# Patient Record
Sex: Female | Born: 1980 | Race: Black or African American | Hispanic: No | Marital: Single | Smoking: Current every day smoker
Health system: Southern US, Community
[De-identification: ages and names within clinical notes are randomized; demographics above are authoritative.]

---

## 2005-11-07 ENCOUNTER — Emergency Department (HOSPITAL_COMMUNITY): Admission: EM | Admit: 2005-11-07 | Discharge: 2005-11-07 | Payer: Self-pay | Admitting: Emergency Medicine

## 2021-07-29 ENCOUNTER — Encounter (HOSPITAL_BASED_OUTPATIENT_CLINIC_OR_DEPARTMENT_OTHER): Payer: Self-pay

## 2021-07-29 ENCOUNTER — Other Ambulatory Visit: Payer: Self-pay

## 2021-07-29 ENCOUNTER — Emergency Department (HOSPITAL_BASED_OUTPATIENT_CLINIC_OR_DEPARTMENT_OTHER): Payer: Commercial Managed Care - PPO

## 2021-07-29 ENCOUNTER — Emergency Department (HOSPITAL_BASED_OUTPATIENT_CLINIC_OR_DEPARTMENT_OTHER)
Admission: EM | Admit: 2021-07-29 | Discharge: 2021-07-29 | Disposition: A | Discharge status: Home / Self Care | Payer: Commercial Managed Care - PPO | Attending: Emergency Medicine | Admitting: Emergency Medicine

## 2021-07-29 DIAGNOSIS — J069 Acute upper respiratory infection, unspecified: Secondary | ICD-10-CM | POA: Insufficient documentation

## 2021-07-29 DIAGNOSIS — Z20822 Contact with and (suspected) exposure to covid-19: Secondary | ICD-10-CM | POA: Diagnosis not present

## 2021-07-29 DIAGNOSIS — R059 Cough, unspecified: Secondary | ICD-10-CM | POA: Diagnosis present

## 2021-07-29 LAB — SARS CORONAVIRUS 2 BY RT PCR: SARS Coronavirus 2 by RT PCR: NEGATIVE

## 2021-07-29 LAB — GROUP A STREP BY PCR: Group A Strep by PCR: NOT DETECTED

## 2021-07-29 MED ORDER — ALBUTEROL SULFATE HFA 108 (90 BASE) MCG/ACT IN AERS
2.0000 | INHALATION_SPRAY | Freq: Once | RESPIRATORY_TRACT | Status: AC
  Administered 2021-07-29: 2 via RESPIRATORY_TRACT
  Filled 2021-07-29: qty 6.7

## 2021-07-29 MED ORDER — BENZONATATE 100 MG PO CAPS
200.0000 mg | ORAL_CAPSULE | Freq: Once | ORAL | Status: AC
  Administered 2021-07-29: 200 mg via ORAL
  Filled 2021-07-29: qty 2

## 2021-07-29 MED ORDER — BENZONATATE 150 MG PO CAPS: 150.0000 mg | ORAL_CAPSULE | Freq: Three times a day (TID) | ORAL | 0 refills | Status: AC

## 2021-07-29 NOTE — ED Notes (Signed)
Patient transported to X-ray 

## 2021-07-29 NOTE — ED Provider Notes (Signed)
[facility] Provider Note   CSN: [id number] Arrival date & time: 07/29/21  1555     History  Chief Complaint  Patient presents with   Cough   Sore Throat    Robin Costa is a 41 y.o. female with no known past medical history presenting today with complaint of throat irritation and cough.  Says its been going on since Friday.  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  She has done this for 7 years but over the past year the canisters seem to irritate her more than usual.  She denies any fevers or chills but says that sometimes she feels pressure in her ears.  Cough is nonproductive.  Also endorsing daily tobacco use but she is working on quitting.  Has a mother in the department with similar symptoms.   Cough Sore Throat       Home Medications Prior to Admission medications   Medication Sig Start Date End Date Taking? Authorizing Provider  benzonatate 150 MG CAPS Take 1 capsule (150 mg total) by mouth every 8 (eight) hours for 7 days. 07/29/21 08/05/21 Yes Jayin Derousse A, PA-C      Allergies    Patient has no known allergies.    Review of Systems   Review of Systems  Respiratory:  Positive for cough.     Physical Exam Updated Vital Signs BP (!) 122/94 (BP Location: Left Arm)   Pulse 60   Temp 98.2 F (36.8 C) (Oral)   Resp 16   Ht 5\' 2"  (1.575 m)   Wt 55 kg   SpO2 100%   BMI 22.19 kg/m  Physical Exam Vitals and nursing note reviewed.  Constitutional:      Appearance: Normal appearance.  HENT:     Head: Normocephalic and atraumatic.     Right Ear: Tympanic membrane normal.     Left Ear: Tympanic membrane normal.     Nose: No congestion.     Mouth/Throat:     Mouth: Mucous membranes are moist.     Pharynx: Oropharynx is clear. Posterior oropharyngeal erythema present. No oropharyngeal exudate.     Tonsils: No tonsillar exudate. 0 on the right. 0 on the left.   Eyes:     General: No scleral icterus.    Conjunctiva/sclera: Conjunctivae normal.  Pulmonary:     Effort: Pulmonary effort is normal. No respiratory distress.     Breath sounds: Wheezing present.     Comments: Wheezes in bilateral upper lung fields, left worse than right. Musculoskeletal:     Cervical back: Normal range of motion.  Skin:    General: Skin is warm and dry.     Findings: No rash.  Neurological:     Mental Status: She is alert.  Psychiatric:        Mood and Affect: Mood normal.     ED Results / Procedures / Treatments   Labs (all labs ordered are listed, but only abnormal results are displayed) Labs Reviewed  GROUP A STREP BY PCR  SARS CORONAVIRUS 2 BY RT PCR    EKG None  Radiology DG Chest 2 View  Result Date: 07/29/2021 CLINICAL DATA:  Cough EXAM: CHEST - 2 VIEW COMPARISON:  Images of patient's previous study done on 08/03/2004 are not available for review at the time of this report. FINDINGS: The heart size and mediastinal contours are within normal limits. Both lungs are clear. The  visualized skeletal structures are unremarkable. IMPRESSION: No active cardiopulmonary disease. Electronically Signed   By: Elmer Picker M.D.   On: 07/29/2021 17:56    Procedures Procedures   Medications Ordered in ED Medications  benzonatate (TESSALON) capsule 200 mg (has no administration in time range)  albuterol (VENTOLIN HFA) 108 (90 Base) MCG/ACT inhaler 2 puff (2 puffs Inhalation Given 07/29/21 1753)    ED Course/ Medical Decision Making/ A&P                           Medical Decision Making Amount and/or Complexity of Data Reviewed Radiology: ordered.  Risk Prescription drug management.   41 year old female presented with a cough and sore throat.  Differential includes but is not limited to COVID, flu, other viral illness, pharyngitis, PTA, RPA, Ludwick's, PE, pneumonia, chemical exposure, asthma, COPD, lung cancer.  Physical exam with bilateral  upper lung lobe wheezing.  Also with a persistent dry cough.  Chest x-ray ordered, viewed and interpreted by me.  Lungs were clear.  I also reviewed patient's triage swabs which are negative for COVID and strep.  Treatment: Given Tessalon and albuterol.  On reevaluation patient reports feeling better.  Her lung sounds are better after inhaler.  Disposition: I believe patient is stable for discharge home with the albuterol inhaler and a prescription for Tessalon Perles.  She is also been given a work note until next week.  I suspect this is a mixture of her tobacco use and chemical inhalation while at work.  She wears a surgical mask but may need an N95.  She is thankful for the treatment today and will follow-up outpatient with primary care with any further concerns.  Will return with worsening symptoms.   Final Clinical Impression(s) / ED Diagnoses Final diagnoses:  Viral URI with cough    Rx / DC Orders ED Discharge Orders          Ordered    benzonatate 150 MG CAPS  Every 8 hours        07/29/21 1738           Results and diagnoses were explained to the patient. Return precautions discussed in full. Patient had no additional questions and expressed complete understanding.   This chart was dictated using voice recognition software.  Despite best efforts to proofread,  errors can occur which can change the documentation meaning.    Rhae Hammock, PA-C [id number] [phone]    Lianne Cure, DO [id number]

## 2021-07-29 NOTE — Discharge Instructions (Addendum)
Your work note is attached.  You do not have COVID or strep.  Your chest x-ray looks normal.    Get a sturdier mask for work to decrease your chemical exposure and continue to try and stop smoking.  The inhaler can be used every 4 hours as needed for cough or chest tightness.  Follow-up with a primary care provider outpatient who can track your progress.  You may call the office on these papers for an appointment.

## 2021-07-29 NOTE — ED Triage Notes (Signed)
Pt c/o cough, sore throat, earaches, congestion and chest discomfort with her cough since Friday.

## 2021-07-29 NOTE — ED Notes (Signed)
Pt discharged to home. Discharge instructions have been discussed with patient and/or family members. Pt verbally acknowledges understanding d/c instructions, and endorses comprehension to checkout at registration before leaving.  °

## 2021-08-07 ENCOUNTER — Encounter (HOSPITAL_BASED_OUTPATIENT_CLINIC_OR_DEPARTMENT_OTHER): Payer: Self-pay

## 2021-08-07 ENCOUNTER — Emergency Department (HOSPITAL_BASED_OUTPATIENT_CLINIC_OR_DEPARTMENT_OTHER): Payer: Commercial Managed Care - PPO

## 2021-08-07 ENCOUNTER — Emergency Department (HOSPITAL_BASED_OUTPATIENT_CLINIC_OR_DEPARTMENT_OTHER)
Admission: EM | Admit: 2021-08-07 | Discharge: 2021-08-07 | Disposition: A | Discharge status: Home / Self Care | Payer: Commercial Managed Care - PPO | Attending: Emergency Medicine | Admitting: Emergency Medicine

## 2021-08-07 ENCOUNTER — Other Ambulatory Visit: Payer: Self-pay

## 2021-08-07 DIAGNOSIS — S39012A Strain of muscle, fascia and tendon of lower back, initial encounter: Secondary | ICD-10-CM

## 2021-08-07 DIAGNOSIS — J069 Acute upper respiratory infection, unspecified: Secondary | ICD-10-CM | POA: Insufficient documentation

## 2021-08-07 DIAGNOSIS — X58XXXA Exposure to other specified factors, initial encounter: Secondary | ICD-10-CM | POA: Diagnosis not present

## 2021-08-07 DIAGNOSIS — S29002A Unspecified injury of muscle and tendon of back wall of thorax, initial encounter: Secondary | ICD-10-CM | POA: Diagnosis present

## 2021-08-07 DIAGNOSIS — S29012A Strain of muscle and tendon of back wall of thorax, initial encounter: Secondary | ICD-10-CM | POA: Insufficient documentation

## 2021-08-07 MED ORDER — ALBUTEROL SULFATE HFA 108 (90 BASE) MCG/ACT IN AERS
1.0000 | INHALATION_SPRAY | Freq: Once | RESPIRATORY_TRACT | Status: AC
  Administered 2021-08-07: 1 via RESPIRATORY_TRACT

## 2021-08-07 MED ORDER — KETOROLAC TROMETHAMINE 15 MG/ML IJ SOLN
15.0000 mg | Freq: Once | INTRAMUSCULAR | Status: AC
  Administered 2021-08-07: 15 mg via INTRAMUSCULAR
  Filled 2021-08-07: qty 1

## 2021-08-07 MED ORDER — ALBUTEROL SULFATE HFA 108 (90 BASE) MCG/ACT IN AERS
INHALATION_SPRAY | RESPIRATORY_TRACT | Status: AC
  Filled 2021-08-07: qty 6.7

## 2022-10-12 IMAGING — CR DG CHEST 2V
2 series · 2 of 2 positions shown · non-contrast
Comparison: Images of patient's previous study done on 08/03/2004
are not available for review at the time of this report.

CLINICAL DATA: Cough

EXAM:
CHEST - 2 VIEW

[w chest pa]
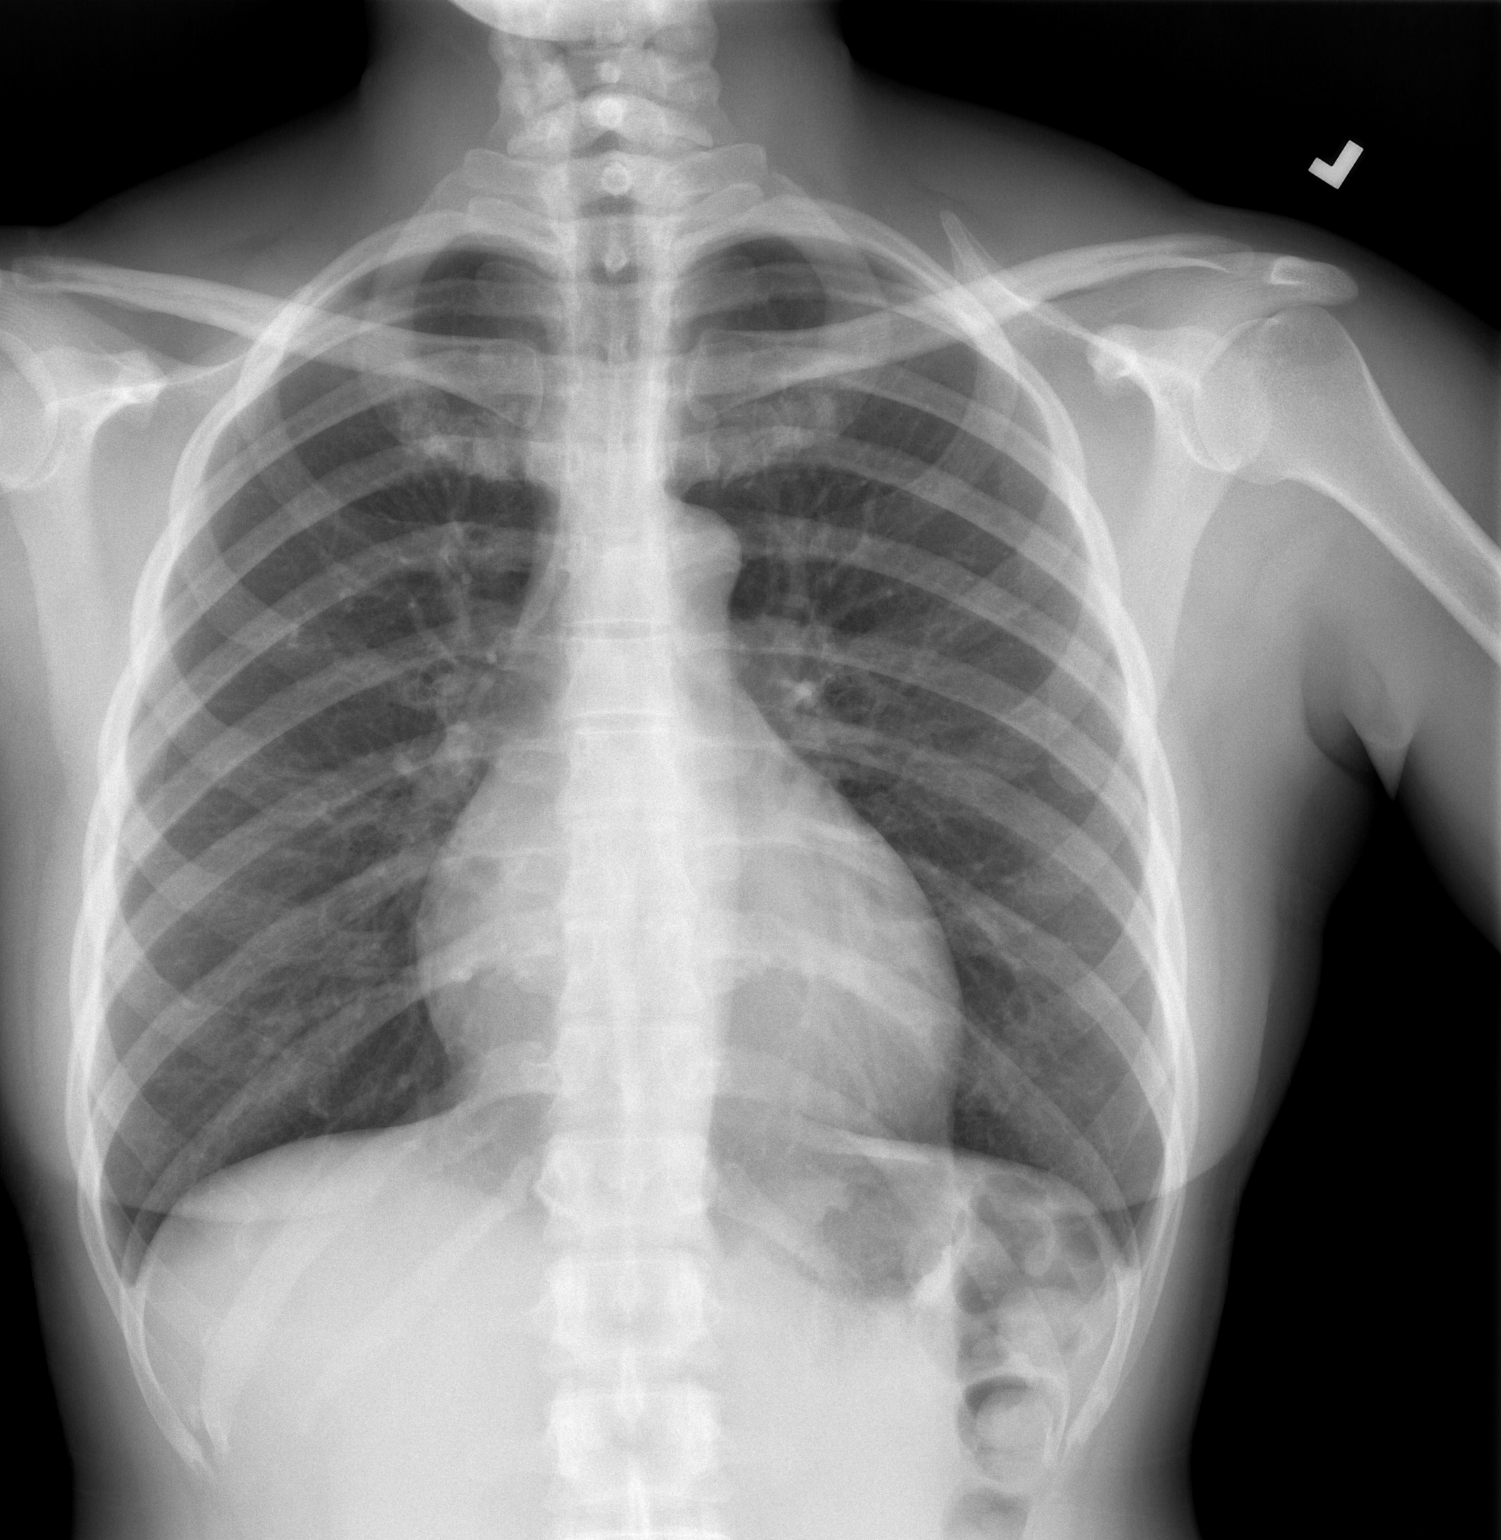

[w chest lat]
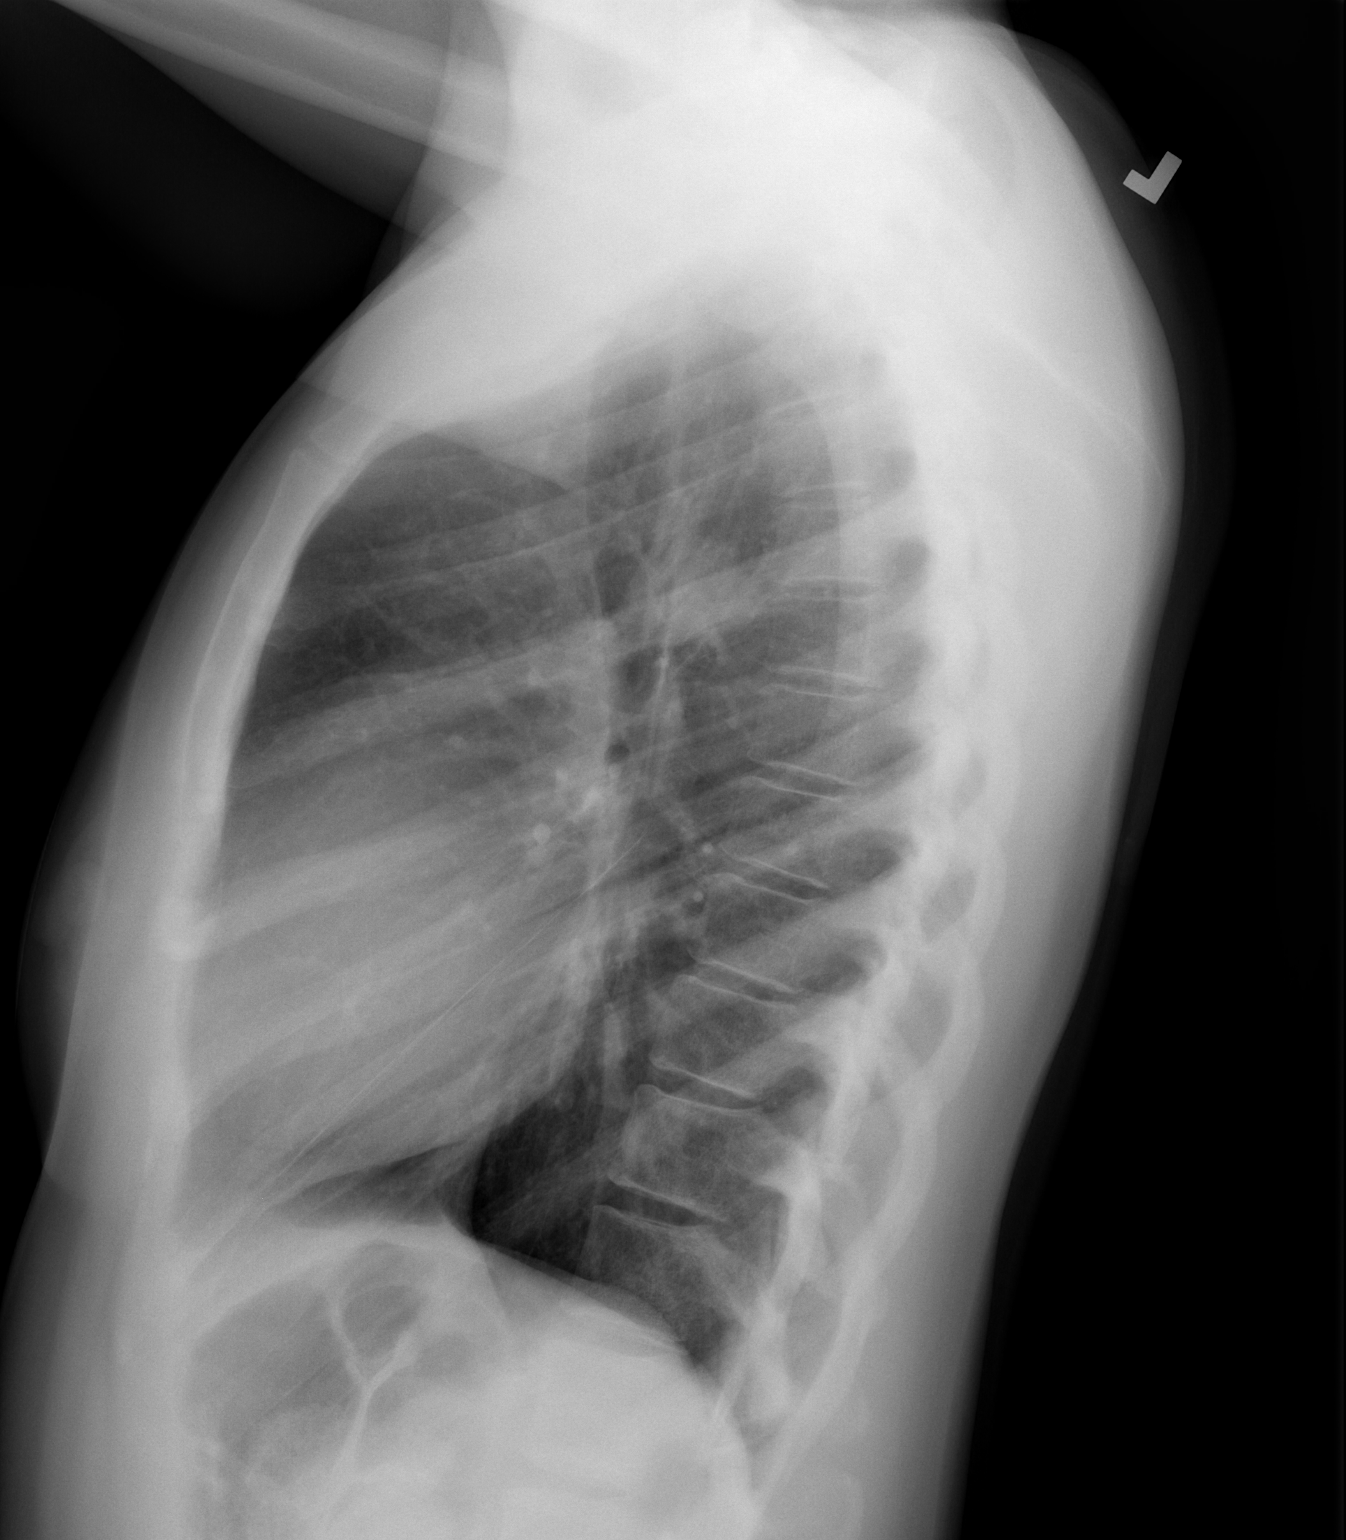

[2 of 2 positions shown; findings below may reference images not displayed]

FINDINGS: The heart size and mediastinal contours are within normal limits.
Both lungs are clear. The visualized skeletal structures are
unremarkable.
IMPRESSION: No active cardiopulmonary disease.

## 2024-03-20 ENCOUNTER — Encounter (HOSPITAL_BASED_OUTPATIENT_CLINIC_OR_DEPARTMENT_OTHER): Payer: Self-pay

## 2024-03-20 ENCOUNTER — Inpatient Hospital Stay (HOSPITAL_BASED_OUTPATIENT_CLINIC_OR_DEPARTMENT_OTHER)
Admission: EM | Admit: 2024-03-20 | Discharge: 2024-03-23 | Disposition: A | Source: Home / Self Care | Attending: Obstetrics & Gynecology | Admitting: Obstetrics & Gynecology

## 2024-03-20 ENCOUNTER — Other Ambulatory Visit: Payer: Self-pay

## 2024-03-20 ENCOUNTER — Emergency Department (HOSPITAL_BASED_OUTPATIENT_CLINIC_OR_DEPARTMENT_OTHER)

## 2024-03-20 DIAGNOSIS — N7093 Salpingitis and oophoritis, unspecified: Principal | ICD-10-CM | POA: Diagnosis present

## 2024-03-20 DIAGNOSIS — A599 Trichomoniasis, unspecified: Secondary | ICD-10-CM | POA: Diagnosis present

## 2024-03-20 LAB — LIPASE, BLOOD: Lipase: <10 U/L — ABNORMAL LOW (ref 11–51)

## 2024-03-20 LAB — RESP PANEL BY RT-PCR (RSV, FLU A&B, COVID)  RVPGX2
Influenza A by PCR: NEGATIVE
Influenza B by PCR: NEGATIVE
Resp Syncytial Virus by PCR: NEGATIVE
SARS Coronavirus 2 by RT PCR: NEGATIVE

## 2024-03-20 LAB — COMPREHENSIVE METABOLIC PANEL WITH GFR
ALT: 14 U/L (ref 0–44)
AST: 23 U/L (ref 15–41)
Albumin: 4.2 g/dL (ref 3.5–5.0)
Alkaline Phosphatase: 146 U/L — ABNORMAL HIGH (ref 38–126)
Anion gap: 16 — ABNORMAL HIGH (ref 5–15)
BUN: <5 mg/dL — ABNORMAL LOW (ref 6–20)
CO2: 26 mmol/L (ref 22–32)
Calcium: 9.8 mg/dL (ref 8.9–10.3)
Chloride: 95 mmol/L — ABNORMAL LOW (ref 98–111)
Creatinine, Ser: 0.81 mg/dL (ref 0.44–1.00)
GFR, Estimated: >60 mL/min
Glucose, Bld: 114 mg/dL — ABNORMAL HIGH (ref 70–99)
Potassium: 3.3 mmol/L — ABNORMAL LOW (ref 3.5–5.1)
Sodium: 138 mmol/L (ref 135–145)
Total Bilirubin: 0.7 mg/dL (ref 0.0–1.2)
Total Protein: 8.8 g/dL — ABNORMAL HIGH (ref 6.5–8.1)

## 2024-03-20 LAB — URINALYSIS, ROUTINE W REFLEX MICROSCOPIC
Glucose, UA: NEGATIVE mg/dL
Ketones, ur: 80 mg/dL — AB
Leukocytes,Ua: NEGATIVE
Nitrite: NEGATIVE
Protein, ur: >=300 mg/dL — AB
Specific Gravity, Urine: 1.025 (ref 1.005–1.030)
pH: 6.5 (ref 5.0–8.0)

## 2024-03-20 LAB — CBC WITH DIFFERENTIAL/PLATELET
Abs Immature Granulocytes: 0.21 10*3/uL — ABNORMAL HIGH (ref 0.00–0.07)
Basophils Absolute: 0.1 10*3/uL (ref 0.0–0.1)
Basophils Relative: 0 %
Eosinophils Absolute: 0 10*3/uL (ref 0.0–0.5)
Eosinophils Relative: 0 %
HCT: 42 % (ref 36.0–46.0)
Hemoglobin: 14 g/dL (ref 12.0–15.0)
Immature Granulocytes: 1 %
Lymphocytes Relative: 7 %
Lymphs Abs: 1.6 10*3/uL (ref 0.7–4.0)
MCH: 28.2 pg (ref 26.0–34.0)
MCHC: 33.3 g/dL (ref 30.0–36.0)
MCV: 84.7 fL (ref 80.0–100.0)
Monocytes Absolute: 1.8 10*3/uL — ABNORMAL HIGH (ref 0.1–1.0)
Monocytes Relative: 8 %
Neutro Abs: 20.2 10*3/uL — ABNORMAL HIGH (ref 1.7–7.7)
Neutrophils Relative %: 84 %
Platelets: 345 10*3/uL (ref 150–400)
RBC: 4.96 MIL/uL (ref 3.87–5.11)
RDW: 14.1 % (ref 11.5–15.5)
Smear Review: NORMAL
WBC: 24 10*3/uL — ABNORMAL HIGH (ref 4.0–10.5)
nRBC: 0 % (ref 0.0–0.2)

## 2024-03-20 LAB — URINALYSIS, MICROSCOPIC (REFLEX)

## 2024-03-20 LAB — WET PREP, GENITAL
Clue Cells Wet Prep HPF POC: NONE SEEN
Sperm: NONE SEEN
WBC, Wet Prep HPF POC: <10
Yeast Wet Prep HPF POC: NONE SEEN

## 2024-03-20 LAB — TROPONIN T, HIGH SENSITIVITY: Troponin T High Sensitivity: <6 ng/L (ref 0–19)

## 2024-03-20 LAB — PREGNANCY, URINE: Preg Test, Ur: NEGATIVE

## 2024-03-20 MED ORDER — METRONIDAZOLE 500 MG/100ML IV SOLN
500.0000 mg | Freq: Once | INTRAVENOUS | Status: AC
  Administered 2024-03-20: 500 mg via INTRAVENOUS
  Filled 2024-03-20: qty 100

## 2024-03-20 MED ORDER — MORPHINE SULFATE (PF) 4 MG/ML IV SOLN
4.0000 mg | Freq: Once | INTRAVENOUS | Status: AC
  Administered 2024-03-20: 4 mg via INTRAVENOUS
  Filled 2024-03-20: qty 1

## 2024-03-20 MED ORDER — LACTATED RINGERS IV SOLN: INTRAVENOUS | Status: AC

## 2024-03-20 MED ORDER — ONDANSETRON HCL 4 MG/2ML IJ SOLN
4.0000 mg | Freq: Once | INTRAMUSCULAR | Status: AC
  Administered 2024-03-20: 4 mg via INTRAVENOUS
  Filled 2024-03-20: qty 2

## 2024-03-20 MED ORDER — PRENATAL MULTIVITAMIN CH: 1.0000 | ORAL_TABLET | Freq: Every day | ORAL | Status: DC

## 2024-03-20 MED ORDER — IOHEXOL 300 MG/ML  SOLN
100.0000 mL | Freq: Once | INTRAMUSCULAR | Status: AC | PRN
  Administered 2024-03-20: 100 mL via INTRAVENOUS

## 2024-03-20 MED ORDER — IBUPROFEN 600 MG PO TABS
600.0000 mg | ORAL_TABLET | Freq: Four times a day (QID) | ORAL | Status: DC | PRN
  Administered 2024-03-20 – 2024-03-22 (×2): 600 mg via ORAL
  Filled 2024-03-20 (×2): qty 1

## 2024-03-20 MED ORDER — SODIUM CHLORIDE 0.9 % IV SOLN
100.0000 mg | Freq: Once | INTRAVENOUS | Status: DC
  Administered 2024-03-20: 100 mg via INTRAVENOUS
  Filled 2024-03-20: qty 100

## 2024-03-20 MED ORDER — SODIUM CHLORIDE 0.9 % IV BOLUS
1000.0000 mL | Freq: Once | INTRAVENOUS | Status: AC
  Administered 2024-03-20: 1000 mL via INTRAVENOUS

## 2024-03-20 MED ORDER — OXYCODONE-ACETAMINOPHEN 5-325 MG PO TABS
1.0000 | ORAL_TABLET | ORAL | Status: DC | PRN
  Administered 2024-03-21 – 2024-03-22 (×3): 1 via ORAL
  Filled 2024-03-20 (×4): qty 1

## 2024-03-20 MED ORDER — SODIUM CHLORIDE 0.9 % IV SOLN
1.0000 g | Freq: Once | INTRAVENOUS | Status: AC
  Administered 2024-03-20: 1 g via INTRAVENOUS
  Filled 2024-03-20: qty 10

## 2024-03-20 MED ORDER — ZOLPIDEM TARTRATE 5 MG PO TABS: 5.0000 mg | ORAL_TABLET | Freq: Every evening | ORAL | Status: DC | PRN

## 2024-03-20 MED ORDER — PRENATAL MULTIVITAMIN CH
1.0000 | ORAL_TABLET | Freq: Every day | ORAL | Status: DC
  Administered 2024-03-21 – 2024-03-22 (×2): 1 via ORAL
  Filled 2024-03-20 (×2): qty 1

## 2024-03-20 MED ORDER — PIPERACILLIN-TAZOBACTAM 3.375 G IVPB
3.3750 g | Freq: Three times a day (TID) | INTRAVENOUS | Status: DC
  Administered 2024-03-20 – 2024-03-23 (×8): 3.375 g via INTRAVENOUS
  Filled 2024-03-20 (×8): qty 50

## 2024-03-20 MED ORDER — LACTATED RINGERS IV BOLUS
1000.0000 mL | Freq: Once | INTRAVENOUS | Status: AC
  Administered 2024-03-21: 1000 mL via INTRAVENOUS

## 2024-03-20 NOTE — H&P (Signed)
 Robin Costa is an 44 y.o. female.[id number] Patient's last menstrual period was 03/16/2024. She is a patient of Dr. Lauretha, last seen on 03/06/24 for Mirena placement after stopping depo provera. She had been having DUB. She says the procedure was painful and she had vaginal bleeding and pain ever since.Pain worsened over the weekend and she had chills and sweats no fever. Nausea no emesis. This morning she went to the ED at [facility] and evaluation showed TOA and she was transferred for admission.  Pertinent Gynecological History: Menses: irregular Bleeding: dysfunctional uterine bleeding Contraception: IUD Blood transfusions: none Sexually transmitted diseases: no past history Last mammogram: abnormal: cyst Date: 04/12/23   Menstrual History:  Patient's last menstrual period was 03/16/2024.    History reviewed. No pertinent past medical history.  History reviewed. No pertinent surgical history.  History reviewed. No pertinent family history.  Social History:  reports that she has been smoking cigarettes. She has never used smokeless tobacco. She reports current alcohol use. She reports that she does not use drugs.  Allergies: Allergies[1]  Medications Prior to Admission  Medication Sig Dispense Refill Last Dose/Taking   lisinopril (ZESTRIL) 20 MG tablet Take 20 mg by mouth daily.   03/19/2024 Morning    Review of Systems  Constitutional:  Positive for chills and diaphoresis.  Respiratory: Negative.    Cardiovascular: Negative.   Gastrointestinal:  Positive for abdominal pain and nausea.  Genitourinary:  Positive for menstrual problem, pelvic pain, vaginal bleeding and vaginal discharge.    Blood pressure 113/70, pulse 73, temperature 98.1 F (36.7 C), temperature source Oral, resp. rate 16, last menstrual period 03/16/2024, SpO2 100%. Physical Exam Vitals and nursing note reviewed.  Constitutional:      Appearance: She is well-developed.  HENT:     Head: Normocephalic and  atraumatic.  Cardiovascular:     Rate and Rhythm: Normal rate.  Pulmonary:     Effort: Pulmonary effort is normal.     Breath sounds: Normal breath sounds.  Abdominal:     Palpations: Abdomen is soft.     Tenderness: There is abdominal tenderness (mild bilateral lower quadrants).  Musculoskeletal:        General: Normal range of motion.     Cervical back: Normal range of motion.  Skin:    General: Skin is warm and dry.  Neurological:     General: No focal deficit present.     Mental Status: She is alert.  Psychiatric:        Mood and Affect: Mood normal.        Behavior: Behavior normal.     Results for orders placed or performed during the hospital encounter of 03/20/24 (from the past 24 hours)  Resp panel by RT-PCR (RSV, Flu A&B, Covid) Anterior Nasal Swab     Status: None   Collection Time: 03/20/24  9:39 AM   Specimen: Anterior Nasal Swab  Result Value Ref Range   SARS Coronavirus 2 by RT PCR NEGATIVE NEGATIVE   Influenza A by PCR NEGATIVE NEGATIVE   Influenza B by PCR NEGATIVE NEGATIVE   Resp Syncytial Virus by PCR NEGATIVE NEGATIVE  Urinalysis, Routine w reflex microscopic -Urine, Clean Catch     Status: Abnormal   Collection Time: 03/20/24 10:00 AM  Result Value Ref Range   Color, Urine YELLOW YELLOW   APPearance CLEAR CLEAR   Specific Gravity, Urine 1.025 1.005 - 1.030   pH 6.5 5.0 - 8.0   Glucose, UA NEGATIVE NEGATIVE mg/dL   Hgb urine  dipstick LARGE (A) NEGATIVE   Bilirubin Urine SMALL (A) NEGATIVE   Ketones, ur 80 (A) NEGATIVE mg/dL   Protein, ur >=699 (A) NEGATIVE mg/dL   Nitrite NEGATIVE NEGATIVE   Leukocytes,Ua NEGATIVE NEGATIVE  Comprehensive metabolic panel     Status: Abnormal   Collection Time: 03/20/24 10:00 AM  Result Value Ref Range   Sodium 138 135 - 145 mmol/L   Potassium 3.3 (L) 3.5 - 5.1 mmol/L   Chloride 95 (L) 98 - 111 mmol/L   CO2 26 22 - 32 mmol/L   Glucose, Bld 114 (H) 70 - 99 mg/dL   BUN <5 (L) 6 - 20 mg/dL   Creatinine, Ser 9.18  0.44 - 1.00 mg/dL   Calcium 9.8 8.9 - 89.6 mg/dL   Total Protein 8.8 (H) 6.5 - 8.1 g/dL   Albumin 4.2 3.5 - 5.0 g/dL   AST 23 15 - 41 U/L   ALT 14 0 - 44 U/L   Alkaline Phosphatase 146 (H) 38 - 126 U/L   Total Bilirubin 0.7 0.0 - 1.2 mg/dL   GFR, Estimated >39 >39 mL/min   Anion gap 16 (H) 5 - 15  Lipase, blood     Status: Abnormal   Collection Time: 03/20/24 10:00 AM  Result Value Ref Range   Lipase <10 (L) 11 - 51 U/L  CBC with Differential     Status: Abnormal   Collection Time: 03/20/24 10:00 AM  Result Value Ref Range   WBC 24.0 (H) 4.0 - 10.5 K/uL   RBC 4.96 3.87 - 5.11 MIL/uL   Hemoglobin 14.0 12.0 - 15.0 g/dL   HCT 57.9 63.9 - 53.9 %   MCV 84.7 80.0 - 100.0 fL   MCH 28.2 26.0 - 34.0 pg   MCHC 33.3 30.0 - 36.0 g/dL   RDW 85.8 88.4 - 84.4 %   Platelets 345 150 - 400 K/uL   nRBC 0.0 0.0 - 0.2 %   Neutrophils Relative % 84 %   Neutro Abs 20.2 (H) 1.7 - 7.7 K/uL   Lymphocytes Relative 7 %   Lymphs Abs 1.6 0.7 - 4.0 K/uL   Monocytes Relative 8 %   Monocytes Absolute 1.8 (H) 0.1 - 1.0 K/uL   Eosinophils Relative 0 %   Eosinophils Absolute 0.0 0.0 - 0.5 K/uL   Basophils Relative 0 %   Basophils Absolute 0.1 0.0 - 0.1 K/uL   WBC Morphology MORPHOLOGY UNREMARKABLE    RBC Morphology MORPHOLOGY UNREMARKABLE    Smear Review Normal platelet morphology    Immature Granulocytes 1 %   Abs Immature Granulocytes 0.21 (H) 0.00 - 0.07 K/uL  Troponin T, High Sensitivity     Status: None   Collection Time: 03/20/24 10:00 AM  Result Value Ref Range   Troponin T High Sensitivity <6 0 - 19 ng/L  Urinalysis, Microscopic (reflex)     Status: Abnormal   Collection Time: 03/20/24 10:00 AM  Result Value Ref Range   RBC / HPF 11-20 0 - 5 RBC/hpf   WBC, UA 0-5 0 - 5 WBC/hpf   Bacteria, UA FEW (A) NONE SEEN   Squamous Epithelial / HPF 0-5 0 - 5 /HPF   Mucus PRESENT    Trichomonas, UA PRESENT (A) NONE SEEN  Pregnancy, urine     Status: None   Collection Time: 03/20/24 10:29 AM  Result  Value Ref Range   Preg Test, Ur NEGATIVE NEGATIVE  Wet prep, genital     Status: Abnormal   Collection Time: 03/20/24  12:44 PM  Result Value Ref Range   Yeast Wet Prep HPF POC NONE SEEN NONE SEEN   Trich, Wet Prep PRESENT (A) NONE SEEN   Clue Cells Wet Prep HPF POC NONE SEEN NONE SEEN   WBC, Wet Prep HPF POC <10 <10   Sperm NONE SEEN     CT ABDOMEN PELVIS W CONTRAST Result Date: 03/20/2024 EXAM: CT ABDOMEN AND PELVIS WITH CONTRAST 03/20/2024 12:06:42 PM TECHNIQUE: CT of the abdomen and pelvis was performed with the administration of 100 mL of iohexol  (OMNIPAQUE ) 300 MG/ML solution. Multiplanar reformatted images are provided for review. Automated exposure control, iterative reconstruction, and/or weight-based adjustment of the mA/kV was utilized to reduce the radiation dose to as low as reasonably achievable. COMPARISON: None available. CLINICAL HISTORY: Abdominal pain, acute, nonlocalized. FINDINGS: LOWER CHEST: No acute abnormality. LIVER: Focal fatty infiltration along the falciform ligament. GALLBLADDER AND BILE DUCTS: Gallbladder is unremarkable. No biliary ductal dilatation. SPLEEN: No acute abnormality. PANCREAS: No acute abnormality. ADRENAL GLANDS: No acute abnormality. KIDNEYS, URETERS AND BLADDER: No stones in the kidneys or ureters. No hydronephrosis. No perinephric or periureteral stranding. The urinary bladder is partially distended and without focal abnormality. GI AND BOWEL: Stomach demonstrates no acute abnormality. The appendix is visualized on the coronal images. No right lower quadrant or pericecal inflammation to suggest acute appendicitis. There is no bowel obstruction. A separate peripherally enhancing fluid collection is also present along the sigmoid mesocolon/presacral space (axial 54) measuring 4.1 cm. Extensive inflammation along the sigmoid mesocolon. PERITONEUM AND RETROPERITONEUM: No ascites. No free air. Multiple enlarged retroperitoneal lymph nodes. VASCULATURE: Aorta is  normal in caliber. LYMPH NODES: Multiple enlarged retroperitoneal lymph nodes. For example, there is a left paraaortic lymph node measuring 8 mm in short axis dimension (axial 22). Enlarged precaval lymph nodes measuring up to 8.3 mm (axial 33). REPRODUCTIVE ORGANS: The left ovary is not well visualized. There is a tubular enhancement structure in the left adnexa measuring up to 2.7 cm thick. An adjacent cystic structure in the left adnexa is also present measuring 6 cm. Intrauterine device within the anteverted uterus. The right ovary appears within normal limits. Extensive inflammation in the pelvis. BONES AND SOFT TISSUES: No acute osseous abnormality. No focal soft tissue abnormality. IMPRESSION: 1. Findings concerning for left-sided tubo-ovarian abscess/pelvic inflammatory disease. There is an associated 4.1 cm pelvic abscess in along the sigmoid mesocolon/presacral space (axial 54). 2. The left ovary was not discretely identified however, there is a left adnexal cyst measuring 6 cm. A repeat pelvic ultrasound in 12 months is recommended to document resolution. 3. Multiple , mildly enlarged retroperitoneal lymph nodes are present, likely reactive. Electronically signed by: Rogelia Myers MD 03/20/2024 12:59 PM EST RP Workstation: [id number]   DG Chest 1 View Result Date: 03/20/2024 CLINICAL DATA:  Chest pain for 3 days EXAM: CHEST  1 VIEW COMPARISON:  August 07, 2021 FINDINGS: The heart size and mediastinal contours are within normal limits. Both lungs are clear. The visualized skeletal structures are unremarkable. IMPRESSION: No active disease. Electronically Signed   By: Lynwood Landy Raddle M.D.   On: 03/20/2024 11:18    Assessment/Plan: 4 cm left TOA, elevated WBC.  Zosyn  ordered, repeat CBC  Lynwood Solomons 03/20/2024, 5:27 PM     [1] No Known Allergies

## 2024-03-20 NOTE — ED Notes (Signed)
 To CT

## 2024-03-20 NOTE — Plan of Care (Signed)

## 2024-03-20 NOTE — ED Triage Notes (Signed)
 Mid chest pain for 3 days. States she feels gassy and can feel it in her chest Also reports constipation for 1 week.  Congestion since yesterday   Started new birth control 3 weeks ago

## 2024-03-20 NOTE — ED Notes (Signed)
Pelvic done with speculum and bi manual spec to lab

## 2024-03-21 LAB — CBC
HCT: 35.3 % — ABNORMAL LOW (ref 36.0–46.0)
Hemoglobin: 11.6 g/dL — ABNORMAL LOW (ref 12.0–15.0)
MCH: 28.1 pg (ref 26.0–34.0)
MCHC: 32.9 g/dL (ref 30.0–36.0)
MCV: 85.5 fL (ref 80.0–100.0)
Platelets: 308 10*3/uL (ref 150–400)
RBC: 4.13 MIL/uL (ref 3.87–5.11)
RDW: 14.6 % (ref 11.5–15.5)
WBC: 20.2 10*3/uL — ABNORMAL HIGH (ref 4.0–10.5)
nRBC: 0 % (ref 0.0–0.2)

## 2024-03-21 LAB — HIV ANTIBODY (ROUTINE TESTING W REFLEX): HIV Screen 4th Generation wRfx: NONREACTIVE

## 2024-03-21 LAB — SYPHILIS: RPR W/REFLEX TO RPR TITER AND TREPONEMAL ANTIBODIES, TRADITIONAL SCREENING AND DIAGNOSIS ALGORITHM: RPR Ser Ql: NONREACTIVE

## 2024-03-21 LAB — GC/CHLAMYDIA PROBE AMP (~~LOC~~) NOT AT ARMC
Chlamydia: NEGATIVE
Comment: NEGATIVE
Comment: NORMAL
Neisseria Gonorrhea: NEGATIVE

## 2024-03-21 MED ORDER — POLYETHYLENE GLYCOL 3350 17 G PO PACK
17.0000 g | PACK | Freq: Every day | ORAL | Status: DC | PRN
  Filled 2024-03-21: qty 1

## 2024-03-21 MED ORDER — METRONIDAZOLE 500 MG PO TABS
500.0000 mg | ORAL_TABLET | Freq: Two times a day (BID) | ORAL | Status: DC
  Administered 2024-03-21 – 2024-03-22 (×4): 500 mg via ORAL
  Filled 2024-03-21 (×4): qty 1

## 2024-03-21 MED ORDER — BISACODYL 10 MG RE SUPP
10.0000 mg | Freq: Every day | RECTAL | Status: DC | PRN
  Administered 2024-03-21: 10 mg via RECTAL
  Filled 2024-03-21: qty 1

## 2024-03-21 NOTE — Progress Notes (Signed)
 Subjective: notes constipation for 3 weeks Patient reports tolerating PO, + flatus, and no problems voiding.    Objective: I have reviewed patient's vital signs, labs, and pathology. Blood pressure 110/61, pulse 70, temperature 97.9 F (36.6 C), temperature source Oral, resp. rate 16, height 5' 2 (1.575 m), weight 61.2 kg, last menstrual period 03/16/2024, SpO2 100%.  General: alert, cooperative, and no distress Resp: normal effort Cardio: regular rate and rhythm GI: minimal low abdominal tenderness no guarding CBC    Component Value Date/Time   WBC 20.2 (H) 03/21/2024 0430   RBC 4.13 03/21/2024 0430   HGB 11.6 (L) 03/21/2024 0430   HCT 35.3 (L) 03/21/2024 0430   PLT 308 03/21/2024 0430   MCV 85.5 03/21/2024 0430   MCH 28.1 03/21/2024 0430   MCHC 32.9 03/21/2024 0430   RDW 14.6 03/21/2024 0430   LYMPHSABS 1.6 03/20/2024 1000   MONOABS 1.8 (H) 03/20/2024 1000   EOSABS 0.0 03/20/2024 1000   BASOSABS 0.1 03/20/2024 1000     Assessment/Plan: Day 2 ABX for PID/TOA Constipation-Miralax  and Dulcolax ordered  LOS: 1 day    Lynwood Solomons, MD 03/21/2024, 9:12 AM

## 2024-03-21 NOTE — Plan of Care (Signed)

## 2024-03-22 DIAGNOSIS — A599 Trichomoniasis, unspecified: Secondary | ICD-10-CM | POA: Diagnosis present

## 2024-03-22 LAB — CBC WITH DIFFERENTIAL/PLATELET
Abs Immature Granulocytes: 0.15 10*3/uL — ABNORMAL HIGH (ref 0.00–0.07)
Basophils Absolute: 0.1 10*3/uL (ref 0.0–0.1)
Basophils Relative: 0 %
Eosinophils Absolute: 0.1 10*3/uL (ref 0.0–0.5)
Eosinophils Relative: 1 %
HCT: 37.7 % (ref 36.0–46.0)
Hemoglobin: 12.1 g/dL (ref 12.0–15.0)
Immature Granulocytes: 1 %
Lymphocytes Relative: 9 %
Lymphs Abs: 1.8 10*3/uL (ref 0.7–4.0)
MCH: 27.7 pg (ref 26.0–34.0)
MCHC: 32.1 g/dL (ref 30.0–36.0)
MCV: 86.3 fL (ref 80.0–100.0)
Monocytes Absolute: 1.4 10*3/uL — ABNORMAL HIGH (ref 0.1–1.0)
Monocytes Relative: 7 %
Neutro Abs: 15.9 10*3/uL — ABNORMAL HIGH (ref 1.7–7.7)
Neutrophils Relative %: 82 %
Platelets: 309 10*3/uL (ref 150–400)
RBC: 4.37 MIL/uL (ref 3.87–5.11)
RDW: 14.9 % (ref 11.5–15.5)
WBC: 19.5 10*3/uL — ABNORMAL HIGH (ref 4.0–10.5)
nRBC: 0 % (ref 0.0–0.2)

## 2024-03-22 MED ORDER — DOXYCYCLINE HYCLATE 100 MG PO TABS
100.0000 mg | ORAL_TABLET | Freq: Two times a day (BID) | ORAL | Status: DC
  Administered 2024-03-22: 100 mg via ORAL
  Filled 2024-03-22: qty 1

## 2024-03-22 MED ORDER — PROMETHAZINE HCL 25 MG PO TABS
25.0000 mg | ORAL_TABLET | Freq: Four times a day (QID) | ORAL | Status: DC | PRN
  Administered 2024-03-22: 25 mg via ORAL
  Filled 2024-03-22: qty 1

## 2024-03-22 NOTE — Progress Notes (Signed)
 Gynecology Progress Note  Admission Date: 03/20/2024 Current Date: 03/22/2024 7:55 AM  Robin Costa is a 44 y.o. [id number] admitted for 4cm left TOA with IUD in place. +trich on admit  History complicated by: Patient Active Problem List   Diagnosis Date Noted   Trichomoniasis 03/22/2024   Tubo-ovarian abscess 03/20/2024   TOA (tubo-ovarian abscess) 03/20/2024    ROS and patient/family/surgical history, located on admission H&P note dated 03/20/2024, have been reviewed, and there are no changes except as noted below Yesterday/Overnight Events:  none  Subjective:  S/s improving.   Objective:    Current Vital Signs 24h Vital Sign Ranges  T 98.6 F (37 C) Temp  Avg: 98.4 F (36.9 C)  Min: 97.8 F (36.6 C)  Max: 98.7 F (37.1 C)  BP 127/79 BP  Min: 104/51  Max: 127/79  HR 80 Pulse  Avg: 79.8  Min: 67  Max: 88  RR 18 Resp  Avg: 17.6  Min: 16  Max: 19  SaO2 98 % Room Air SpO2  Avg: 99.6 %  Min: 98 %  Max: 100 %       24 Hour I/O Current Shift I/O  Time Ins Outs 02/04 0701 - 02/05 0700 In: 102.1 [I.V.:3.5] Out: -  No intake/output data recorded.   Physical exam: General appearance: alert, cooperative, and appears stated age Abdomen: soft, minimally ttp, no peritoneal s/s Lungs: no respiratory distress Extremities: no c/c/e Skin: warm and dry Psych: appropriate Neurologic: Grossly normal  Medications Current Facility-Administered Medications  Medication Dose Route Frequency Provider Last Rate Last Admin   bisacodyl  (DULCOLAX) suppository 10 mg  10 mg Rectal Daily PRN Arnold, James G, MD   10 mg at 03/21/24 1002   ibuprofen  (ADVIL ) tablet 600 mg  600 mg Oral Q6H PRN Arnold, James G, MD   600 mg at 03/20/24 2220   metroNIDAZOLE  (FLAGYL ) tablet 500 mg  500 mg Oral Q12H Arnold, James G, MD   500 mg at 03/21/24 2140   oxyCODONE -acetaminophen  (PERCOCET/ROXICET) 5-325 MG per tablet 1-2 tablet  1-2 tablet Oral Q3H PRN Arnold, James G, MD   1 tablet at 03/21/24 1840    piperacillin -tazobactam (ZOSYN ) IVPB 3.375 g  3.375 g Intravenous Q8H Eveline Lynwood MATSU, MD 12.5 mL/hr at 03/22/24 0229 3.375 g at 03/22/24 0229   polyethylene glycol (MIRALAX  / GLYCOLAX ) packet 17 g  17 g Oral Daily PRN Arnold, James G, MD       prenatal multivitamin tablet 1 tablet  1 tablet Oral Q1200 Eveline Lynwood MATSU, MD   1 tablet at 03/21/24 1126   zolpidem  (AMBIEN ) tablet 5 mg  5 mg Oral QHS PRN Eveline Lynwood MATSU, MD       Labs  +trich Negative gc/ct/hiv/rpr  Recent Labs  Lab 03/20/24 1000 03/21/24 0430  WBC 24.0* 20.2*  HGB 14.0 11.6*  HCT 42.0 35.3*  PLT 345 308    Recent Labs  Lab 03/20/24 1000  NA 138  K 3.3*  CL 95*  CO2 26  BUN <5*  CREATININE 0.81  CALCIUM 9.8  PROT 8.8*  BILITOT 0.7  ALKPHOS 146*  ALT 14  AST 23  GLUCOSE 114*   Radiology No new imaging  Assessment & Plan:  Patient doing well *GYN: f/u am cbc. If continues to downtrend, okay for d/c to home on abx. Will start doxy and continue flagyl  as outpatient. Flagyl  D#3. Zosyn  D#3 (will d/c) -s/p rocephin  x 2 dose and IV doxy x 1 dose  Code Status: Full  Code  Total time taking care of the patient was 15 minutes, with greater than 50% of the time spent in face to face interaction with the patient.  Bebe Izell Overcast MD Attending Center [facility])

## 2024-03-23 ENCOUNTER — Other Ambulatory Visit (HOSPITAL_COMMUNITY): Payer: Self-pay

## 2024-03-23 LAB — CBC
HCT: 31.8 % — ABNORMAL LOW (ref 36.0–46.0)
Hemoglobin: 10.8 g/dL — ABNORMAL LOW (ref 12.0–15.0)
MCH: 28.6 pg (ref 26.0–34.0)
MCHC: 34 g/dL (ref 30.0–36.0)
MCV: 84.1 fL (ref 80.0–100.0)
Platelets: 363 10*3/uL (ref 150–400)
RBC: 3.78 MIL/uL — ABNORMAL LOW (ref 3.87–5.11)
RDW: 14.8 % (ref 11.5–15.5)
WBC: 15.8 10*3/uL — ABNORMAL HIGH (ref 4.0–10.5)
nRBC: 0 % (ref 0.0–0.2)

## 2024-03-23 MED ORDER — POLYETHYLENE GLYCOL 3350 17 GM/SCOOP PO POWD
17.0000 g | Freq: Every day | ORAL | 0 refills | Status: AC | PRN
  Filled 2024-03-23: qty 238, 14d supply, fill #0

## 2024-03-23 MED ORDER — METRONIDAZOLE 500 MG PO TABS
500.0000 mg | ORAL_TABLET | Freq: Two times a day (BID) | ORAL | Status: DC
  Administered 2024-03-23: 500 mg via ORAL
  Filled 2024-03-23: qty 1

## 2024-03-23 MED ORDER — IBUPROFEN 600 MG PO TABS
600.0000 mg | ORAL_TABLET | Freq: Four times a day (QID) | ORAL | 0 refills | Status: AC | PRN
  Filled 2024-03-23: qty 30, 8d supply, fill #0

## 2024-03-23 MED ORDER — AMOXICILLIN-POT CLAVULANATE 875-125 MG PO TABS
1.0000 | ORAL_TABLET | Freq: Two times a day (BID) | ORAL | Status: DC
  Administered 2024-03-23: 1 via ORAL
  Filled 2024-03-23: qty 1

## 2024-03-23 MED ORDER — METRONIDAZOLE 500 MG PO TABS
500.0000 mg | ORAL_TABLET | Freq: Two times a day (BID) | ORAL | 0 refills | Status: AC
  Filled 2024-03-23: qty 10, 5d supply, fill #0

## 2024-03-23 MED ORDER — OXYCODONE HCL 5 MG PO TABS
5.0000 mg | ORAL_TABLET | ORAL | 0 refills | Status: AC | PRN
  Filled 2024-03-23: qty 15, 3d supply, fill #0

## 2024-03-23 MED ORDER — AMOXICILLIN-POT CLAVULANATE 875-125 MG PO TABS
1.0000 | ORAL_TABLET | Freq: Two times a day (BID) | ORAL | 0 refills | Status: AC
  Filled 2024-03-23: qty 56, 28d supply, fill #0

## 2024-03-23 MED ORDER — PROMETHAZINE HCL 25 MG PO TABS
25.0000 mg | ORAL_TABLET | Freq: Four times a day (QID) | ORAL | 0 refills | Status: AC | PRN
  Filled 2024-03-23: qty 30, 8d supply, fill #0

## 2024-03-23 NOTE — Progress Notes (Signed)
Discharge instructions and prescriptions given to pt. Discussed signs and symptoms to report to the MD, upcoming appointments, and meds. Pt verbalizes understanding and has no questions or concerns at this time. Pt discharged home from hospital in stable condition. 

## 2024-03-23 NOTE — Discharge Summary (Incomplete)
 Physician Discharge Summary  Patient ID: Robin Costa MRN: [id number] DOB/AGE: 43-Sep-1982 43 y.o.  Admit date: 03/20/2024 Discharge date: 03/23/2024  Admission Diagnoses:  Discharge Diagnoses:  Principal Problem:   Tubo-ovarian abscess Active Problems:   TOA (tubo-ovarian abscess)   Trichomoniasis   Discharged Condition: {condition:18240}  Hospital Course: ***  Consults: {consultation:18241}  Significant Diagnostic Studies: {diagnostics:18242}  Treatments: {Tx:18249}  Discharge Exam: Blood pressure 120/69, pulse 68, temperature 97.7 F (36.5 C), temperature source Oral, resp. rate 17, height 5' 2 (1.575 m), weight 61.2 kg, last menstrual period 03/16/2024, SpO2 100%. {physical zkjf:6958869}  Disposition: Discharge disposition: 01-Home or Self Care       Discharge Instructions     Discharge patient   Complete by: As directed    After meds to beds and morning augmentin  is given   Discharge disposition: 01-Home or Self Care   Discharge patient date: 03/23/2024      Allergies as of 03/23/2024   No Known Allergies      Medication List     TAKE these medications    amoxicillin -clavulanate 875-125 MG tablet Commonly known as: AUGMENTIN  Take 1 tablet by mouth every 12 (twelve) hours for 28 days.   ibuprofen  600 MG tablet Commonly known as: ADVIL  Take 1 tablet (600 mg total) by mouth every 6 (six) hours as needed (mild pain).   lisinopril 20 MG tablet Commonly known as: ZESTRIL Take 20 mg by mouth daily.   metroNIDAZOLE  500 MG tablet Commonly known as: FLAGYL  Take 1 tablet (500 mg total) by mouth every 12 (twelve) hours for 5 days.   oxyCODONE  5 MG immediate release tablet Commonly known as: Roxicodone  Take 1 tablet (5 mg total) by mouth every 4 (four) hours as needed for severe pain (pain score 7-10).   polyethylene glycol 17 g packet Commonly known as: MIRALAX  / GLYCOLAX  Take 17 g by mouth daily as needed for severe constipation.   promethazine   25 MG tablet Commonly known as: PHENERGAN  Take 1 tablet (25 mg total) by mouth every 6 (six) hours as needed for nausea or vomiting.        Follow-up Information     [facility]. Go in 1 week(s).   Specialty: Obstetrics and Gynecology Why: call the office on 2/9 Contact information: [address] [place]  [address] [phone]                Signed: Bebe Furry 03/23/2024, 8:02 AM

## 2024-03-23 NOTE — Progress Notes (Signed)
 CSW followed up with patient at bedside. CSW introduced self and explained reason for consult. Patient confirmed that she needed a taxi voucher to discharge home and denied any additional transportation resources. Patient confirmed her address and denied any additional needs. Patient thanked CSW.   CSW provided taxi voucher to patient's RN.  Suzen Law, LCSW Clinical Social Worker [facility] Cell#: [phone]

## 2024-03-23 NOTE — Discharge Instructions (Signed)
 Robin Costa
